# Patient Record
Sex: Male | Born: 1995 | Race: White | Hispanic: No | Marital: Single | Smoking: Never smoker
Health system: Southern US, Community
[De-identification: ages and names within clinical notes are randomized; demographics above are authoritative.]

## PROBLEM LIST (undated history)

## (undated) HISTORY — PX: NO PAST SURGERIES: SHX2092

---
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
## 2018-03-25 ENCOUNTER — Other Ambulatory Visit: Payer: Self-pay

## 2018-03-25 ENCOUNTER — Ambulatory Visit
Admission: EM | Admit: 2018-03-25 | Discharge: 2018-03-25 | Disposition: A | Discharge status: Home / Self Care | Payer: Managed Care, Other (non HMO) | Attending: Family Medicine | Admitting: Family Medicine

## 2018-03-25 DIAGNOSIS — B9789 Other viral agents as the cause of diseases classified elsewhere: Secondary | ICD-10-CM | POA: Diagnosis not present

## 2018-03-25 DIAGNOSIS — J029 Acute pharyngitis, unspecified: Secondary | ICD-10-CM

## 2018-03-25 LAB — RAPID STREP SCREEN (MED CTR MEBANE ONLY): Streptococcus, Group A Screen (Direct): NEGATIVE

## 2018-03-25 MED ORDER — IBUPROFEN 800 MG PO TABS: 800.0000 mg | ORAL_TABLET | Freq: Three times a day (TID) | ORAL | 0 refills | Status: AC

## 2018-03-25 NOTE — Discharge Instructions (Signed)
Rest, fluids. ° °Medication as prescribed. ° °Take care ° °Dr. Noemie Devivo  °

## 2018-03-25 NOTE — ED Provider Notes (Signed)
[facility]    CSN: [id number] Arrival date & time: 03/25/18  1519  History   Chief Complaint Chief Complaint  Patient presents with  . Sore Throat   HPI  22 year old male presents with sore throat.  Patient reports sore throat started on Tuesday.  He reports associated congestion and headache.  No fever.  He is taken some allergy medication and over-the-counter cold medication without improvement.  Mild to moderate in severity.  No known exacerbating factors.  No other associated symptoms.  No other complaints at this time.  PMH: No significant PMH.  Past Surgical History:  Procedure Laterality Date  . NO PAST SURGERIES     Home Medications    Prior to Admission medications   Medication Sig Start Date End Date Taking? Authorizing Provider  ibuprofen (ADVIL,MOTRIN) 800 MG tablet Take 1 tablet (800 mg total) by mouth 3 (three) times daily. 03/25/18   Tommie Samsook, Skylinn Vialpando G, DO    Family History History reviewed. No pertinent family history.  Social History Social History   Tobacco Use  . Smoking status: Never Smoker  . Smokeless tobacco: Never Used  Substance Use Topics  . Alcohol use: Never    Frequency: Never  . Drug use: Never     Allergies   Patient has no known allergies.   Review of Systems Review of Systems  Constitutional: Negative for fever.  HENT: Positive for congestion and sore throat.   Neurological: Positive for headaches.   Physical Exam Triage Vital Signs ED Triage Vitals  Enc Vitals Group     BP 03/25/18 1530 127/76     Pulse Rate 03/25/18 1530 72     Resp 03/25/18 1530 16     Temp 03/25/18 1530 98.8 F (37.1 C)     Temp Source 03/25/18 1530 Oral     SpO2 03/25/18 1530 98 %     Weight 03/25/18 1529 180 lb (81.6 kg)     Height 03/25/18 1529 5\' 8"  (1.727 m)     Head Circumference --      Peak Flow --      Pain Score 03/25/18 1529 3     Pain Loc --      Pain Edu? --      Excl. in GC? --    Updated Vital Signs BP 127/76 (BP  Location: Left Arm)   Pulse 72   Temp 98.8 F (37.1 C) (Oral)   Resp 16   Ht 5\' 8"  (1.727 m)   Wt 180 lb (81.6 kg)   SpO2 98%   BMI 27.37 kg/m     Physical Exam  Constitutional: He appears well-developed. No distress.  HENT:  Head: Normocephalic and atraumatic.  Oropharynx with mild erythema.  Eyes: Conjunctivae are normal. Right eye exhibits no discharge. Left eye exhibits no discharge.  Cardiovascular: Normal rate and regular rhythm.  Pulmonary/Chest: Effort normal and breath sounds normal. He has no wheezes. He has no rales.  Neurological: He is alert.  Psychiatric: He has a normal mood and affect. His behavior is normal.  Nursing note and vitals reviewed.  UC Treatments / Results  Labs (all labs ordered are listed, but only abnormal results are displayed) Labs Reviewed  [facility]EN [facility])  CULTURE, GROUP A STREP [facility])    EKG None Radiology No results found.  Procedures Procedures (including critical care time)  Medications Ordered in UC Medications - No data to display   Initial Impression / Assessment and Plan / UC  Course  I have reviewed the triage vital signs and the nursing notes.  Pertinent labs & imaging results that were available during my care of the patient were reviewed by me and considered in my medical decision making (see chart for details).     22 year old male presents with viral pharyngitis.  Strep negative.  Treating with ibuprofen.  Final Clinical Impressions(s) / UC Diagnoses   Final diagnoses:  Viral pharyngitis    ED Discharge Orders        Ordered    ibuprofen (ADVIL,MOTRIN) 800 MG tablet  3 times daily     03/25/18 1551     Controlled Substance Prescriptions [place] Controlled Substance Registry consulted? Not Applicable   Tommie Sams, DO 03/25/18 1610

## 2018-03-25 NOTE — ED Triage Notes (Signed)
Patient complains of sore throat that has started on Tuesday. Patient reports painful swallowing, with head congestion and headaches.

## 2018-03-28 LAB — CULTURE, GROUP A STREP (THRC)
